# Patient Record
Sex: Male | Born: 1970
Health system: Southern US, Community
[De-identification: ages and names within clinical notes are randomized; demographics above are authoritative.]

## PROBLEM LIST (undated history)

## (undated) DIAGNOSIS — J45909 Unspecified asthma, uncomplicated: Secondary | ICD-10-CM

## (undated) DIAGNOSIS — J302 Other seasonal allergic rhinitis: Secondary | ICD-10-CM

## (undated) HISTORY — PX: BONE CYST EXCISION: SHX376

## (undated) HISTORY — DX: Unspecified asthma, uncomplicated: J45.909

## (undated) HISTORY — DX: Other seasonal allergic rhinitis: J30.2

---

## 2011-12-13 ENCOUNTER — Encounter (HOSPITAL_COMMUNITY): Payer: Self-pay

## 2011-12-13 ENCOUNTER — Emergency Department (HOSPITAL_COMMUNITY): Payer: BC Managed Care – PPO

## 2011-12-13 ENCOUNTER — Emergency Department (HOSPITAL_COMMUNITY)
Admission: EM | Admit: 2011-12-13 | Discharge: 2011-12-13 | Disposition: A | Discharge status: Home / Self Care | Payer: BC Managed Care – PPO | Attending: Emergency Medicine | Admitting: Emergency Medicine

## 2011-12-13 ENCOUNTER — Other Ambulatory Visit: Payer: Self-pay

## 2011-12-13 DIAGNOSIS — R079 Chest pain, unspecified: Secondary | ICD-10-CM | POA: Insufficient documentation

## 2011-12-13 LAB — DIFFERENTIAL
Basophils Absolute: 0 10*3/uL (ref 0.0–0.1)
Eosinophils Relative: 3 % (ref 0–5)
Lymphocytes Relative: 42 % (ref 12–46)
Lymphs Abs: 2.7 10*3/uL (ref 0.7–4.0)
Monocytes Absolute: 0.5 10*3/uL (ref 0.1–1.0)
Monocytes Relative: 8 % (ref 3–12)
Neutro Abs: 2.9 10*3/uL (ref 1.7–7.7)

## 2011-12-13 LAB — CBC
HCT: 42.1 % (ref 39.0–52.0)
Hemoglobin: 14.8 g/dL (ref 13.0–17.0)
MCV: 83 fL (ref 78.0–100.0)
RBC: 5.07 MIL/uL (ref 4.22–5.81)
RDW: 12.3 % (ref 11.5–15.5)
WBC: 6.3 10*3/uL (ref 4.0–10.5)

## 2011-12-13 LAB — BASIC METABOLIC PANEL
CO2: 27 mEq/L (ref 19–32)
Chloride: 104 mEq/L (ref 96–112)
Glucose, Bld: 85 mg/dL (ref 70–99)
Sodium: 140 mEq/L (ref 135–145)

## 2011-12-13 LAB — POCT I-STAT TROPONIN I: Troponin i, poc: 0 ng/mL (ref 0.00–0.08)

## 2011-12-13 MED ORDER — GI COCKTAIL ~~LOC~~
30.0000 mL | Freq: Once | ORAL | Status: AC
  Administered 2011-12-13: 30 mL via ORAL
  Filled 2011-12-13: qty 30

## 2011-12-13 MED ORDER — PANTOPRAZOLE SODIUM 40 MG PO TBEC
40.0000 mg | DELAYED_RELEASE_TABLET | Freq: Once | ORAL | Status: AC
  Administered 2011-12-13: 40 mg via ORAL
  Filled 2011-12-13: qty 1

## 2011-12-13 MED ORDER — HYDROCODONE-ACETAMINOPHEN 5-325 MG PO TABS: 1.0000 | ORAL_TABLET | ORAL | Status: AC | PRN

## 2011-12-13 MED ORDER — OMEPRAZOLE 20 MG PO CPDR: 20.0000 mg | DELAYED_RELEASE_CAPSULE | Freq: Every day | ORAL | Status: DC

## 2011-12-13 MED ORDER — NITROGLYCERIN 0.4 MG SL SUBL
0.4000 mg | SUBLINGUAL_TABLET | SUBLINGUAL | Status: DC | PRN
  Administered 2011-12-13: 0.4 mg via SUBLINGUAL
  Filled 2011-12-13: qty 25

## 2011-12-13 MED ORDER — SUCRALFATE 1 G PO TABS: 1.0000 g | ORAL_TABLET | Freq: Four times a day (QID) | ORAL | Status: DC

## 2011-12-13 NOTE — ED Notes (Signed)
Pt placed on 02 at 2LPM

## 2011-12-13 NOTE — ED Notes (Signed)
Lt. Side chest pain began while running errands. Described as squeezing,  Denies any sob or n/v, does not feel well. Pain radiates into his lt. Arm, nothing makes the pain worse or better.   Pt took 2 aspirin prior to coming Pt is in NAD< skin is pink, warm and dry. Resp. E/u

## 2011-12-13 NOTE — ED Notes (Signed)
Pt st's he was walking in store and developed pain in left chest radiating into left arm.  Pt denied any nausea, vomiting, diaphoresis or shortness of breath.  Pt also st's he had same symptoms approx 2 yrs ago with neg. Stress test.  At this time pt st's chest pain has subsided but continues to have a squeezing feeling in left arm. PT alert and oriented x's 3, skin warm and dry, color appropriate.

## 2011-12-13 NOTE — ED Provider Notes (Signed)
History     CSN: [id number]  Arrival date & time 12/13/11  1704   First MD Initiated Contact with Patient 12/13/11 1848      Chief Complaint  Patient presents with  . Chest Pain    (Consider location/radiation/quality/duration/timing/severity/associated sxs/prior treatment) HPI History provided by pt.   Pt developed pain left anterior chest while making breakfast at 10:30am today.  Describes as intermittent squeezing sensation that radiates down left arm.  Non-exertional, non-positional, non-pleuritic.  No relief w/ aspirin.  No associated fever, cough, SOB, nausea, diaphoresis, LE pain/edema.  Denies recent trauma.  Does not smoke cigarettes and no immediate FH of MI.  Had an exercise stress echo approx 3 years ago that was negative and PCP attributed CP at that time to acid reflux. No RF for PE.    History reviewed. No pertinent past medical history.  History reviewed. No pertinent past surgical history.  No family history on file.  History  Substance Use Topics  . Smoking status: Never Smoker   . Smokeless tobacco: Not on file  . Alcohol Use: No      Review of Systems  All other systems reviewed and are negative.    Allergies  Review of patient's allergies indicates no known allergies.  Home Medications  No current outpatient prescriptions on file.  BP 137/91  Pulse 76  Temp(Src) 97.9 F (36.6 C) (Oral)  Resp 18  SpO2 100%  Physical Exam  Nursing note and vitals reviewed. Constitutional: He is oriented to person, place, and time. He appears well-developed and well-nourished. No distress.  HENT:  Head: Normocephalic and atraumatic.  Eyes:       Normal appearance  Neck: Normal range of motion.  Cardiovascular: Normal rate, regular rhythm and intact distal pulses.   Pulmonary/Chest: Effort normal and breath sounds normal. No respiratory distress.       Tenderness left anterior chest.  No pain w/ movement or ROM LUE.  No pleuritic pain reported  Abdominal:  Soft. Bowel sounds are normal. He exhibits no distension. There is no tenderness. There is no guarding.  Musculoskeletal: Normal range of motion.       No peripheral edema or calf tenderness.  LUE non-tender and full ROM w/out pain.    Neurological: He is alert and oriented to person, place, and time.  Skin: Skin is warm and dry. No rash noted.  Psychiatric: He has a normal mood and affect. His behavior is normal.    ED Course  Procedures (including critical care time)  Labs Reviewed  BASIC METABOLIC PANEL - Abnormal; Notable for the following:    GFR calc non Af Amer 73 (*)    GFR calc Af Amer 84 (*)    All other components within normal limits  CBC  DIFFERENTIAL  POCT I-STAT TROPONIN I  LAB REPORT - SCANNED   Dg Chest 2 View  12/13/2011  *RADIOLOGY REPORT*  Clinical Data: Chest and left arm pain.  CHEST - 2 VIEW  Comparison: None.  Findings: Lungs are clear.  No pneumothorax or pleural fluid. Heart size normal.  IMPRESSION: Negative chest.  Original Report Authenticated By: Bernadene Bell. D'ALESSIO, M.D.     1. Chest pain       MDM  Healthy 41yo M, low risk ACS, presents w/ non-traumatic, intermittent, squeezing pain in left chest, w/ radiation down left arm since 10:30am today.  Non-exertional and no associated sx.  Neg stress echo approx 90yrs ago.  Pain currently minimal and is reproducible w/  palpation on exam.  EKG non-ischemic and CXR/troponin pending.  SL nitro ordered for pain.  Will reassess shortly.  7:11 PM   CXR and troponin neg and labs otherwise unremarkable.  No change in pain w/ SL nitro.  Will try a GI cocktail and po protonix.     Pain has resolved since receiving GI cocktail.  Pt has been diagnosed w/ acid reflux in the past and I suspect that that is the etiology of his symptoms today.  Again, low risk ACS, neg stress 21yrs ago, features of pain atypical w/ exception of radiation, non-ischemic EKG and pain resolved w/ GI cocktail.  D/c'd home w/ prilosec and  carafate and referral to cardiology for outpatient stress test.  Strict return precautions discussed.         Otilio Miu, [place] 12/14/11 1239

## 2011-12-13 NOTE — ED Provider Notes (Signed)
5:40 PM  Date: 12/13/2011  Rate: 76  Rhythm: normal sinus rhythm  QRS Axis: normal  Intervals: normal  ST/T Wave abnormalities: normal  Conduction Disutrbances:none  Narrative Interpretation: Normal EKG  Old EKG Reviewed: none available    Carleene Cooper III, MD 12/13/11 [phone]

## 2011-12-13 NOTE — ED Notes (Signed)
Pt returns from x-ray.  Continues to deny any chest pain st's left arm slightly uncomfortable.  B/P 126/87 P 70  NSR on monitor.  Wife remains at bedside.

## 2011-12-13 NOTE — ED Notes (Signed)
Pt undressed, in gown, on monitor, continuous pulse oximetry and blood pressure cuff; family at bedside 

## 2011-12-13 NOTE — Discharge Instructions (Signed)
Take prilosec once a day for the next two weeks.  Take carafate up to four times a day as needed.  If pain is severe, you can take a vicodin.  Do not drive within four hours of taking this medication (may cause drowsiness or confusion).  Follow up with the cardiologist when you return home for an outpatient stress test.  Please be seen by a physician immediately if you develop worsening pain or shortness of breath.

## 2011-12-14 NOTE — ED Provider Notes (Signed)
Medical screening examination/treatment/procedure(s) were performed by non-physician practitioner and as supervising physician I was immediately available for consultation/collaboration.   Carleene Cooper III, MD 12/14/11 1341

## 2013-12-28 IMAGING — CR DG CHEST 2V
2 series · 2 of 2 positions shown · non-contrast
Comparison: None.

CLINICAL DATA: Chest and left arm pain.

CHEST - 2 VIEW

[w chest pa]
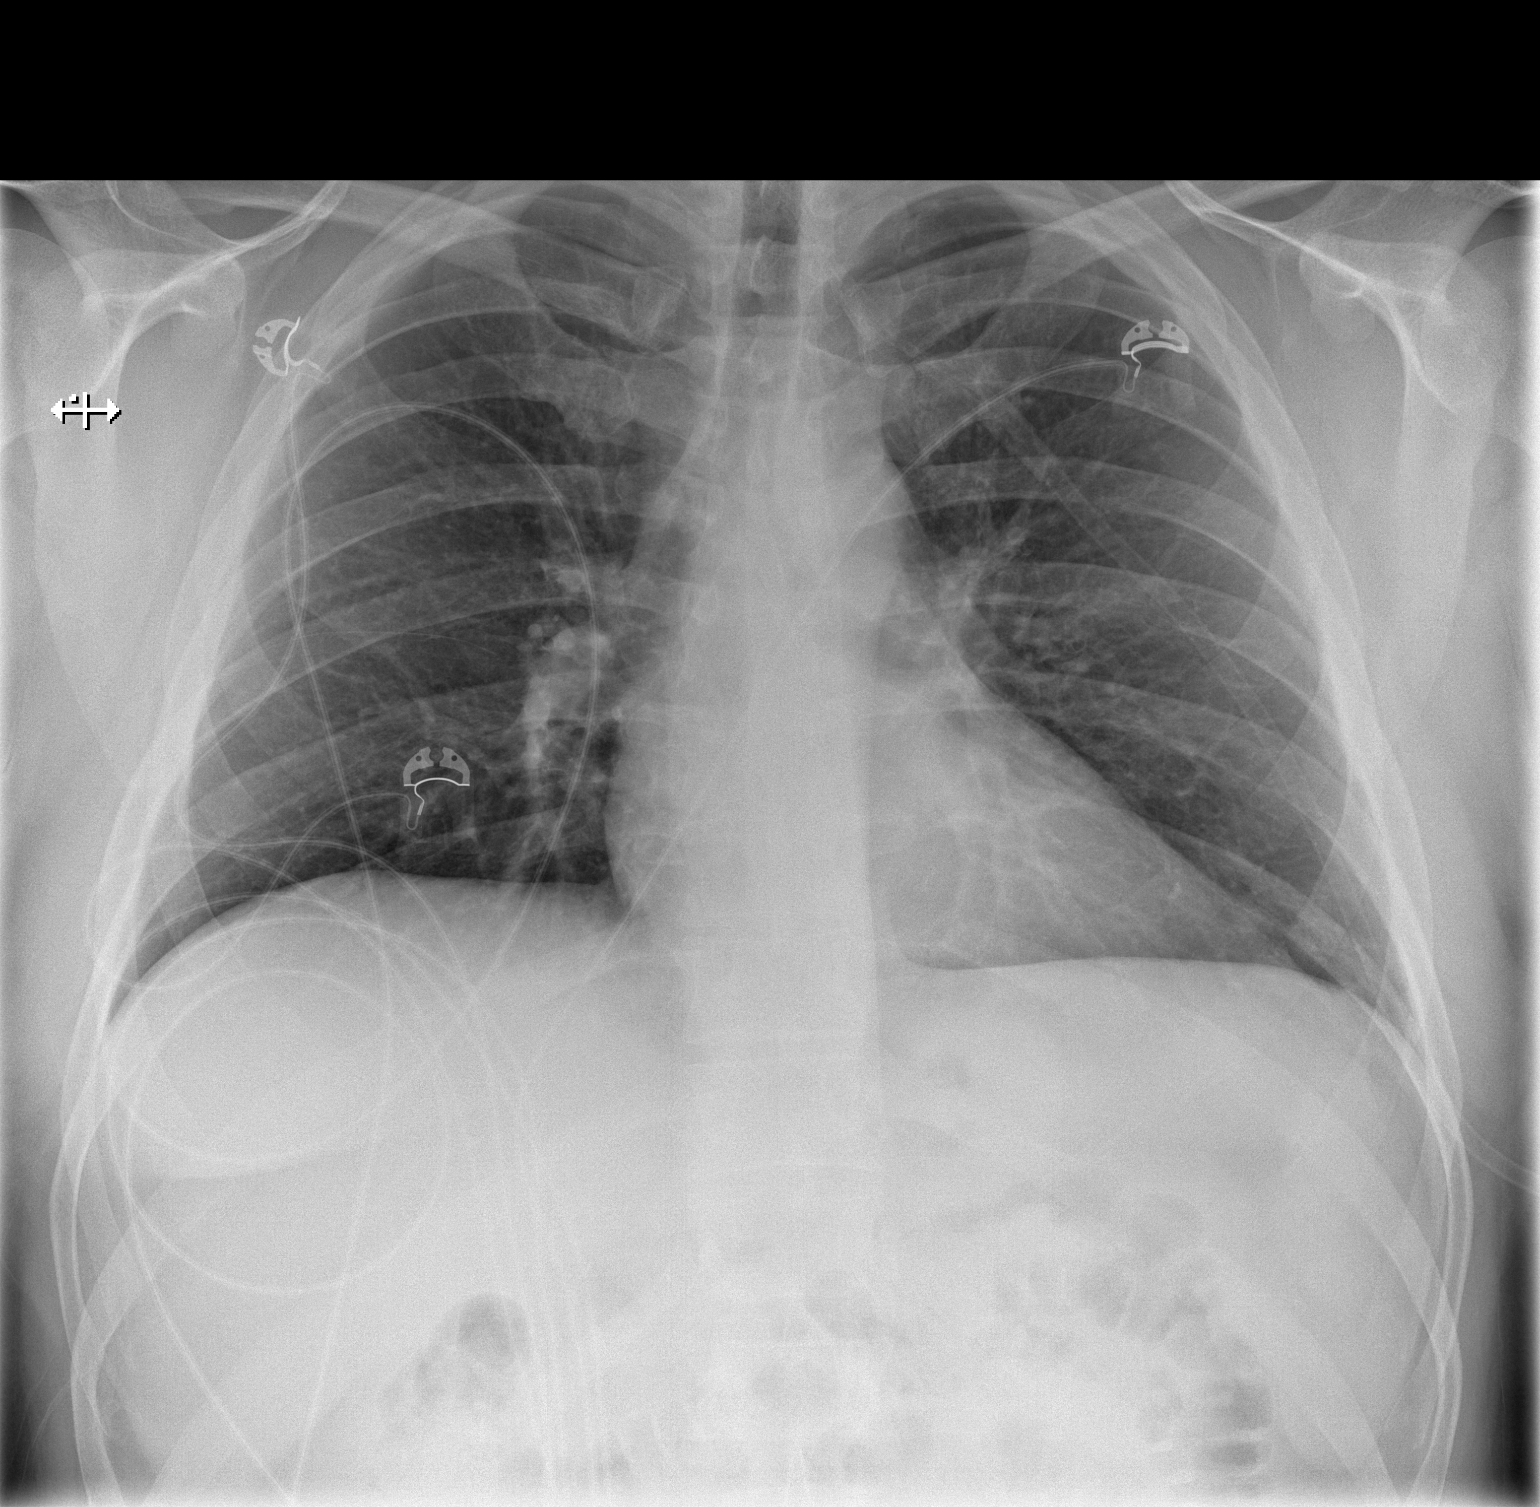

[w chest lat]
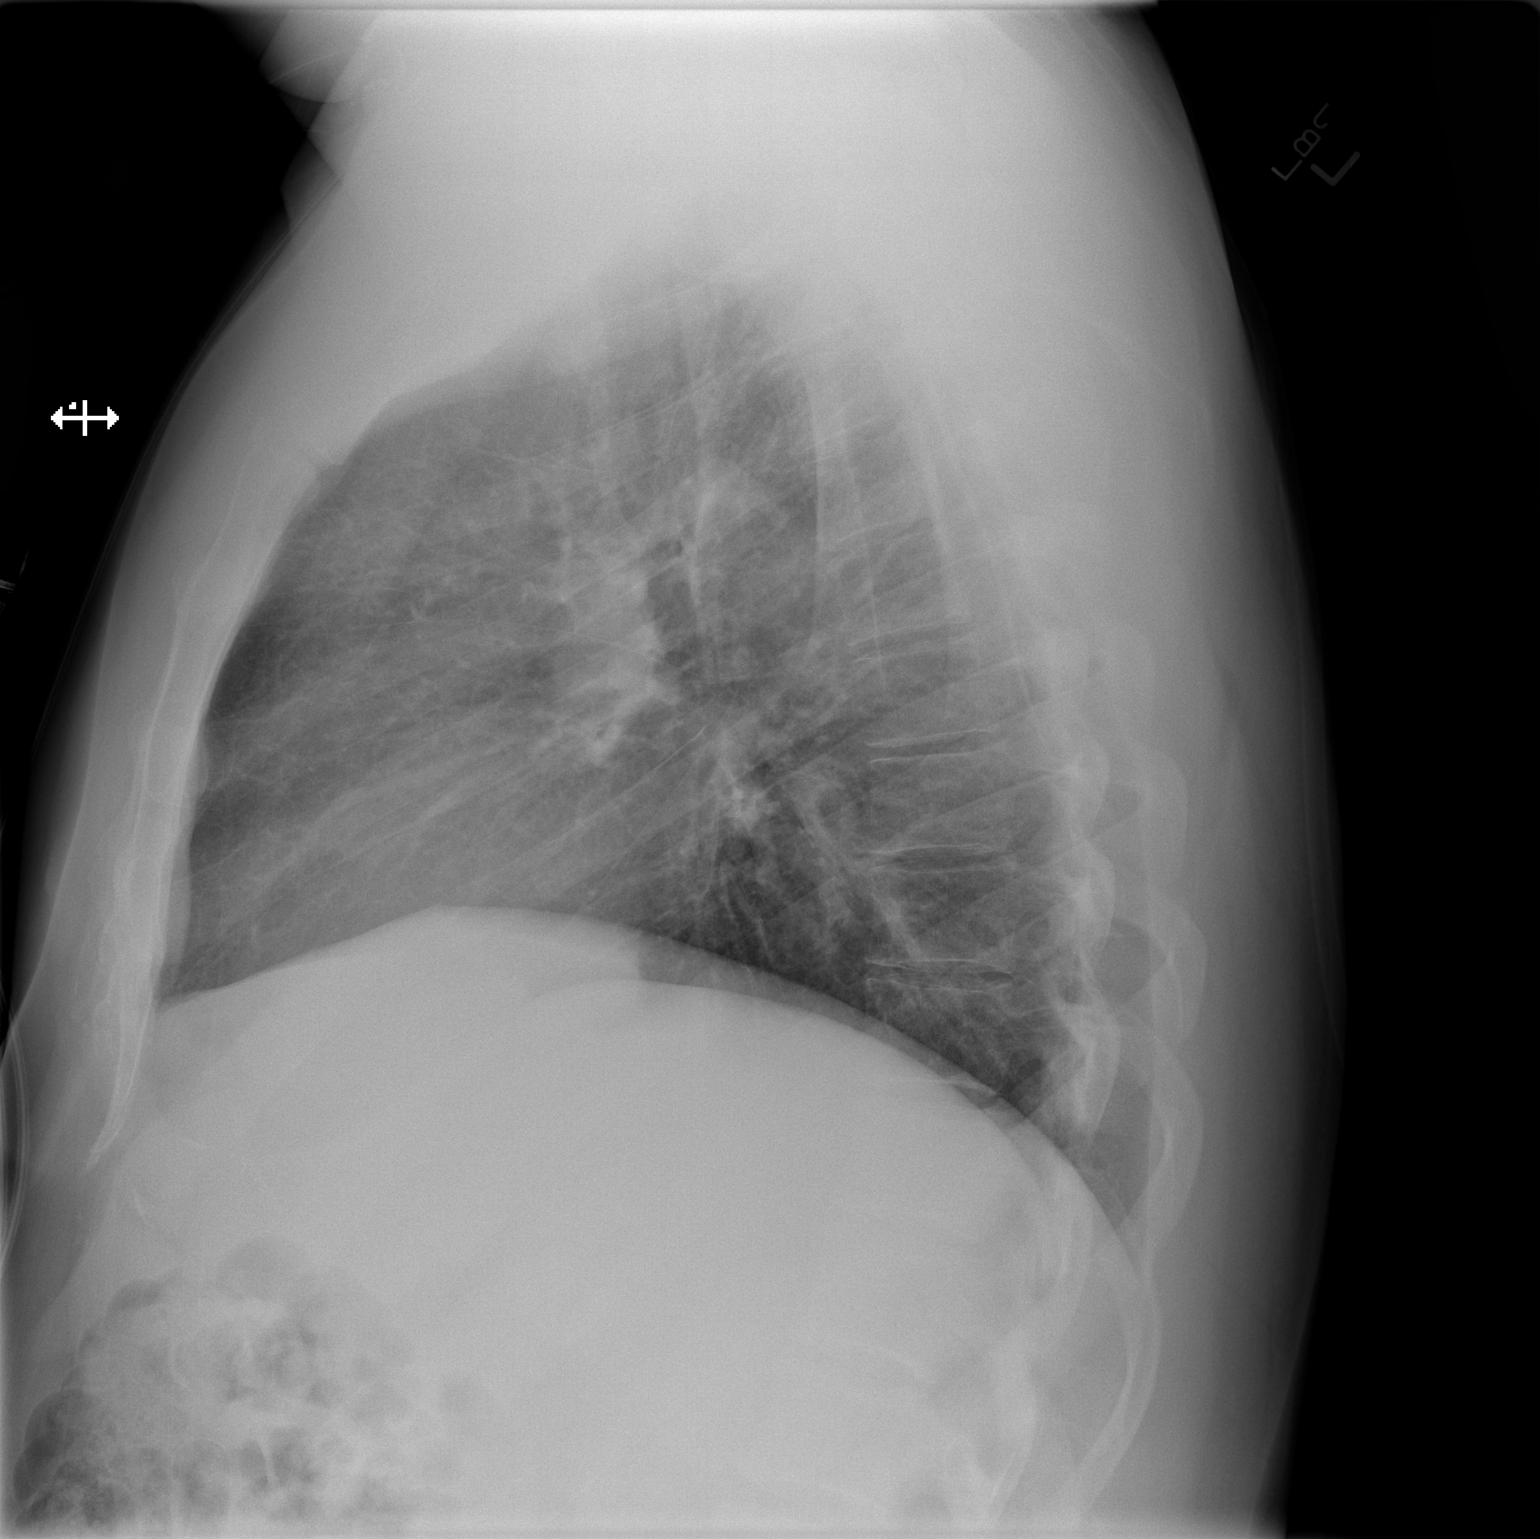

[2 of 2 positions shown; findings below may reference images not displayed]

FINDINGS: Lungs are clear.  No pneumothorax or pleural fluid.
Heart size normal.
IMPRESSION: Negative chest.

## 2019-01-27 ENCOUNTER — Encounter: Payer: Self-pay | Admitting: *Deleted

## 2019-11-01 ENCOUNTER — Emergency Department (HOSPITAL_COMMUNITY)
Admission: EM | Admit: 2019-11-01 | Discharge: 2019-11-01 | Disposition: A | Discharge status: Home / Self Care | Payer: 59 | Attending: Emergency Medicine | Admitting: Emergency Medicine

## 2019-11-01 ENCOUNTER — Emergency Department (HOSPITAL_COMMUNITY): Payer: 59

## 2019-11-01 ENCOUNTER — Encounter (HOSPITAL_COMMUNITY): Payer: Self-pay | Admitting: Emergency Medicine

## 2019-11-01 ENCOUNTER — Other Ambulatory Visit: Payer: Self-pay

## 2019-11-01 DIAGNOSIS — J45909 Unspecified asthma, uncomplicated: Secondary | ICD-10-CM | POA: Insufficient documentation

## 2019-11-01 DIAGNOSIS — R0789 Other chest pain: Secondary | ICD-10-CM

## 2019-11-01 DIAGNOSIS — M79602 Pain in left arm: Secondary | ICD-10-CM | POA: Diagnosis present

## 2019-11-01 LAB — CBC
HCT: 49.5 % (ref 39.0–52.0)
Hemoglobin: 16.5 g/dL (ref 13.0–17.0)
MCH: 28.9 pg (ref 26.0–34.0)
MCHC: 33.3 g/dL (ref 30.0–36.0)
MCV: 86.8 fL (ref 80.0–100.0)
Platelets: 232 10*3/uL (ref 150–400)
RBC: 5.7 MIL/uL (ref 4.22–5.81)
RDW: 11.9 % (ref 11.5–15.5)
WBC: 7.3 10*3/uL (ref 4.0–10.5)
nRBC: 0 % (ref 0.0–0.2)

## 2019-11-01 LAB — BASIC METABOLIC PANEL
Anion gap: 10 (ref 5–15)
BUN: 15 mg/dL (ref 6–20)
CO2: 27 mmol/L (ref 22–32)
Calcium: 9.3 mg/dL (ref 8.9–10.3)
Chloride: 103 mmol/L (ref 98–111)
Creatinine, Ser: 1.21 mg/dL (ref 0.61–1.24)
GFR calc Af Amer: >60 mL/min (ref >60)
GFR calc non Af Amer: >60 mL/min (ref >60)
Glucose, Bld: 107 mg/dL — ABNORMAL HIGH (ref 70–99)
Potassium: 3.8 mmol/L (ref 3.5–5.1)
Sodium: 140 mmol/L (ref 135–145)

## 2019-11-01 LAB — TROPONIN I (HIGH SENSITIVITY): Troponin I (High Sensitivity): 12 ng/L (ref <18)

## 2019-11-01 LAB — PROTIME-INR
INR: 1 (ref 0.8–1.2)
Prothrombin Time: 12.7 seconds (ref 11.4–15.2)

## 2019-11-01 MED ORDER — SODIUM CHLORIDE 0.9% FLUSH: 3.0000 mL | Freq: Once | INTRAVENOUS | Status: DC

## 2019-11-01 NOTE — Discharge Instructions (Addendum)
Your cardiac labs today were reassuring.  Your EKG was normal.  Chest x-ray was clear.  I recommend close follow-up with your primary care physician who may refer you for stress test or echocardiogram as an outpatient.  If you have worsening symptoms, shortness of breath, sudden sweating, feel like you are going to pass out, have chest pressure or tightness, please return to the emergency department.

## 2019-11-01 NOTE — ED Notes (Signed)
Patient verbalizes understanding of discharge instructions. Opportunity for questioning and answers were provided. Armband removed by staff, pt discharged from ED.  

## 2019-11-01 NOTE — ED Triage Notes (Signed)
Patient reports left chest pain and left arm pain with diaphoresis at triage , no SOB , no cough or emesis .

## 2019-11-01 NOTE — ED Provider Notes (Signed)
TIME SEEN: 12:59 AM  CHIEF COMPLAINT: Left arm pain, chest pain  HPI: Patient is a 49 year old male who presents to the emergency department with complaints of left arm discomfort and chest discomfort.  Pain in chest like a "tooth ache" on left side that started Sunday night.  Describes it as a "stinging".  Left arm pressure started Sunday night.  Able to play basketball without symptoms on Saturday.  No SOB, N/V, diaphoresis, dizziness.  Pain waxes and wanes and at times is gone.  Felt worsening arm pain at 11pm.  No A/A factors.  No radiation of pain.  Started in arm then felt a different pain in chest.  No pain with arm movement but is umcomfortable to touch at proximal portion.  When drew blood in triage had a syncopal event which he is had in the past.  No change in chest or arm pain at that time.  No HTN, DM, HLD, tobacco abuse.  Grandfather had MI in 58s.  Flew to [facility] on March 4 and back on the 6th (flight 3 hours).  No history of PE, DVT, exogenous estrogen use, recent fractures, surgery, trauma, hospitalization. No lower extremity swelling or pain. No calf tenderness.  ROS: See HPI Constitutional: no fever  Eyes: no drainage  ENT: no runny nose   Cardiovascular:  no chest pain  Resp: no SOB  GI: no vomiting GU: no dysuria Integumentary: no rash  Allergy: no hives  Musculoskeletal: no leg swelling  Neurological: no slurred speech ROS otherwise negative  PAST MEDICAL HISTORY/PAST SURGICAL HISTORY:  Past Medical History:  Diagnosis Date  . Asthma   . Seasonal allergies     MEDICATIONS:  Prior to Admission medications   Not on File    ALLERGIES:  No Known Allergies  SOCIAL HISTORY:  Social History   Tobacco Use  . Smoking status: Never Smoker  . Smokeless tobacco: Never Used  Substance Use Topics  . Alcohol use: No    FAMILY HISTORY: No family history on file.  EXAM: BP 94/64 (BP Location: Left Arm)   Pulse 66   Temp 97.7 F (36.5 C) (Oral)   Resp  18   Ht 6\' 3"  (1.905 m)   Wt (!) 140 kg   SpO2 93%   BMI 38.58 kg/m  CONSTITUTIONAL: Alert and oriented and responds appropriately to questions. Well-appearing; well-nourished HEAD: Normocephalic EYES: Conjunctivae clear, pupils appear equal, EOM appear intact ENT: normal nose; moist mucous membranes NECK: Supple, normal ROM CARD: RRR; S1 and S2 appreciated; no murmurs, no clicks, no rubs, no gallops RESP: Normal chest excursion without splinting or tachypnea; breath sounds clear and equal bilaterally; no wheezes, no rhonchi, no rales, no hypoxia or respiratory distress, speaking full sentences ABD/GI: Normal bowel sounds; non-distended; soft, non-tender, no rebound, no guarding, no peritoneal signs, no hepatosplenomegaly BACK:  The back appears normal EXT: Normal ROM in all joints; no deformity noted, no edema; no cyanosis, compartments soft, 2+ radial pulses bilaterally, no calf tenderness or calf swelling, mild tenderness in the upper portion of the left upper extremity with palpation but no redness, warmth, swelling, ecchymosis or deformity SKIN: Normal color for age and race; warm; no rash on exposed skin NEURO: Moves all extremities equally PSYCH: The patient's mood and manner are appropriate.   MEDICAL DECISION MAKING: Patient here with atypical chest pain.  Seems mostly to have left arm discomfort.  No sign of septic arthritis, compartment syndrome, DVT, arterial obstruction.  No history of injury.  No  sign of fracture.  No sign of cellulitis, abscess.  He states he was able to exercise on Saturday and did not have any symptoms.  This could be a muscle strain from playing basketball.  I recommended alternating Tylenol and Motrin.  He declines any pain medicine here.  He did have a syncopal event upfront with having his blood drawn but states he has had this before.  Suspect this was vasovagal in nature.  Recommended close follow-up with his primary care physician.  Patient's score through  the heart pathway is 1.  Have offered a second troponin but he declines.  I feel this is reasonable given patient has had ongoing pain for 2 days.  He is concerned that he will have another syncopal event if he has blood drawn.  At this time I do not feel he needs further work-up.  Low suspicion for dissection.  No risk factors for PE other than age.  He is not tachycardic, hypotensive or hypoxic.  I do not think he needs admission at this time.  Patient comfortable with this plan.  Has a PCP for follow-up.  At this time, I do not feel there is any life-threatening condition present. I have reviewed, interpreted and discussed all results (EKG, imaging, lab, urine as appropriate) and exam findings with patient/family. I have reviewed nursing notes and appropriate previous records.  I feel the patient is safe to be discharged home without further emergent workup and can continue workup as an outpatient as needed. Discussed usual and customary return precautions. Patient/family verbalize understanding and are comfortable with this plan.  Outpatient follow-up has been provided as needed. All questions have been answered.    EKG Interpretation  Date/Time:  Tuesday November 01 2019 00:14:41 EDT Ventricular Rate:  55 PR Interval:  178 QRS Duration: 84 QT Interval:  432 QTC Calculation: 413 R Axis:   34 Text Interpretation: Sinus bradycardia Low voltage QRS Borderline ECG Isolated T wave changes in aVL compared to prior in 2013 Confirmed by Rochele Raring [phone]) on 11/01/2019 12:34:25 AM         David Long was evaluated in Emergency Department on 11/01/2019 for the symptoms described in the history of present illness. He was evaluated in the context of the global COVID-19 pandemic, which necessitated consideration that the patient might be at risk for infection with the SARS-CoV-2 virus that causes COVID-19. Institutional protocols and algorithms that pertain to the evaluation of patients at risk for  COVID-19 are in a state of rapid change based on information released by regulatory bodies including the CDC and federal and state organizations. These policies and algorithms were followed during the patient's care in the ED.  Patient was seen wearing N95, face shield, gloves.     David Long, Layla Maw, DO 11/01/19 [phone]

## 2020-01-09 ENCOUNTER — Ambulatory Visit: Payer: 59 | Admitting: Family Medicine

## 2020-03-23 ENCOUNTER — Other Ambulatory Visit: Payer: Self-pay

## 2020-03-23 ENCOUNTER — Ambulatory Visit (INDEPENDENT_AMBULATORY_CARE_PROVIDER_SITE_OTHER): Payer: 59 | Admitting: Family Medicine

## 2020-03-23 ENCOUNTER — Encounter: Payer: Self-pay | Admitting: Family Medicine

## 2020-03-23 VITALS — BP 127/89 | HR 82 | Temp 98.0°F | Ht 75.0 in | Wt 286.8 lb

## 2020-03-23 DIAGNOSIS — R739 Hyperglycemia, unspecified: Secondary | ICD-10-CM

## 2020-03-23 DIAGNOSIS — Z0001 Encounter for general adult medical examination with abnormal findings: Secondary | ICD-10-CM

## 2020-03-23 DIAGNOSIS — Z13220 Encounter for screening for lipoid disorders: Secondary | ICD-10-CM

## 2020-03-23 DIAGNOSIS — M25511 Pain in right shoulder: Secondary | ICD-10-CM | POA: Diagnosis not present

## 2020-03-23 DIAGNOSIS — R0789 Other chest pain: Secondary | ICD-10-CM | POA: Diagnosis not present

## 2020-03-23 DIAGNOSIS — M25569 Pain in unspecified knee: Secondary | ICD-10-CM

## 2020-03-23 DIAGNOSIS — Z1211 Encounter for screening for malignant neoplasm of colon: Secondary | ICD-10-CM

## 2020-03-23 DIAGNOSIS — M25512 Pain in left shoulder: Secondary | ICD-10-CM | POA: Diagnosis not present

## 2020-03-23 MED ORDER — DICLOFENAC SODIUM 75 MG PO TBEC: 75.0000 mg | DELAYED_RELEASE_TABLET | Freq: Two times a day (BID) | ORAL | 0 refills | Status: AC

## 2020-03-23 NOTE — Progress Notes (Signed)
Chief Complaint:  David Long is a 49 y.o. male who presents today for his annual comprehensive physical exam.  He is a new patient.   Assessment/Plan:  New/Acute Problems: Knee Pain Likely has underlying osteoarthritis.  Advised to follow-up with orthopedics  Bilateral shoulder pain Concern for rotator cuff tendinopathy.  Will start diclofenac.  Discussed home exercises and handout was given.  Will consider referral to sports medicine or he can follow-up with orthopedics if not improving with this.  Atypical chest pain No symptoms today and has not had any symptoms for 5 months.  He request stress test for further evaluation.  Place referral today to cardiology.  Also check risk stratification labs with A1c and lipids.  Chronic Problems Addressed Today: Body mass index is 35.85 kg/m. / Obese  BMI Metric Follow Up - 03/23/20 1525      BMI Metric Follow Up-Please document annually   BMI Metric Follow Up Education provided            Preventative Healthcare: Check CBC, CMET, TSH, lipid panel, A1c.  Will place referral for colonoscopy.  Up-to-date on Covid vaccine.  Patient Counseling(The following topics were reviewed and/or handout was given):  -Nutrition: Stressed importance of moderation in sodium/caffeine intake, saturated fat and cholesterol, caloric balance, sufficient intake of fresh fruits, vegetables, and fiber.  -Stressed the importance of regular exercise.   -Substance Abuse: Discussed cessation/primary prevention of tobacco, alcohol, or other drug use; driving or other dangerous activities under the influence; availability of treatment for abuse.   -Injury prevention: Discussed safety belts, safety helmets, smoke detector, smoking near bedding or upholstery.   -Sexuality: Discussed sexually transmitted diseases, partner selection, use of condoms, avoidance of unintended pregnancy and contraceptive alternatives.   -Dental health: Discussed importance of regular tooth  brushing, flossing, and dental visits.  -Health maintenance and immunizations reviewed. Please refer to Health maintenance section.  Return to care in 1 year for next preventative visit.     Subjective:  HPI:  Patient here to establish care.  He would like an annual physical with labs today.  He went to the emergency room about 5 months ago with atypical chest pain.  Had work-up that time which was essentially negative.  He was advised to follow-up with cardiology but this has not yet happened.  Requests referral today.  Has not had any recurrence of chest pain since he was in the ED 4 months ago.  Does have a family history of heart attack in his paternal grandfather.  Patient also has longstanding history of bilateral knee pain.  Saw orthopedics about 5 years ago and was told that he would eventually need replacement.  Symptoms have been overall stable.  He has occasionally taken Advil which helps.  Has also had bilateral shoulder pain for the past 2 to 3 years.  Comes and goes.  Has pain at night when sleeping on his side that subsides after changing sleep position.  Lifestyle Diet: None.  Exercise: None.   No flowsheet data found.  Health Maintenance Due  Topic Date Due  . Hepatitis C Screening  Never done  . HIV Screening  Never done  . TETANUS/TDAP  Never done  . COLONOSCOPY  Never done     ROS: Per HPI, otherwise a complete review of systems was negative.   PMH:  The following were reviewed and entered/updated in epic: Past Medical History:  Diagnosis Date  . Asthma   . Seasonal allergies    There are no  problems to display for this patient.  Past Surgical History:  Procedure Laterality Date  . BONE CYST EXCISION      Family History  Problem Relation Age of Onset  . Diabetes Mother   . Hypertension Father   . Heart attack Paternal Grandfather     Medications- reviewed and updated Current Outpatient Medications  Medication Sig Dispense Refill  .  diclofenac (VOLTAREN) 75 MG EC tablet Take 1 tablet (75 mg total) by mouth 2 (two) times daily. 30 tablet 0   No current facility-administered medications for this visit.    Allergies-reviewed and updated No Known Allergies  Social History   Socioeconomic History  . Marital status: Married    Spouse name: Not on file  . Number of children: Not on file  . Years of education: Not on file  . Highest education level: Not on file  Occupational History  . Not on file  Tobacco Use  . Smoking status: Never Smoker  . Smokeless tobacco: Never Used  Substance and Sexual Activity  . Alcohol use: Yes    Comment: OCC  . Drug use: No  . Sexual activity: Not on file  Other Topics Concern  . Not on file  Social History Narrative  . Not on file   Social Determinants of Health   Financial Resource Strain:   . Difficulty of Paying Living Expenses: Not on file  Food Insecurity:   . Worried About Programme researcher, broadcasting/film/video in the Last Year: Not on file  . Ran Out of Food in the Last Year: Not on file  Transportation Needs:   . Lack of Transportation (Medical): Not on file  . Lack of Transportation (Non-Medical): Not on file  Physical Activity:   . Days of Exercise per Week: Not on file  . Minutes of Exercise per Session: Not on file  Stress:   . Feeling of Stress : Not on file  Social Connections:   . Frequency of Communication with Friends and Family: Not on file  . Frequency of Social Gatherings with Friends and Family: Not on file  . Attends Religious Services: Not on file  . Active Member of Clubs or Organizations: Not on file  . Attends Banker Meetings: Not on file  . Marital Status: Not on file        Objective:  Physical Exam: BP 127/89   Pulse 82   Temp 98 F (36.7 C) (Temporal)   Ht 6\' 3"  (1.905 m)   Wt 286 lb 12.8 oz (130.1 kg)   SpO2 96%   BMI 35.85 kg/m   Body mass index is 35.85 kg/m. Wt Readings from Last 3 Encounters:  03/23/20 286 lb 12.8 oz  (130.1 kg)  11/01/19 (!) 308 lb 10.3 oz (140 kg)   Gen: NAD, resting comfortably HEENT: TMs normal bilaterally. OP clear. No thyromegaly noted.  CV: RRR with no murmurs appreciated Pulm: NWOB, CTAB with no crackles, wheezes, or rhonchi GI: Normal bowel sounds present. Soft, Nontender, Nondistended. MSK: no edema, cyanosis, or clubbing noted - Shoulders: No deformities.  Pain elicited with resisted supraspinatus testing.  Normal internal and external rotation.  Neurovascular intact distally -Knees: No deformities.  Crepitus with active and passive range of motion.  Full range of motion throughout.  Neurovascularly intact distally. Skin: warm, dry Neuro: CN2-12 grossly intact. Strength 5/5 in upper and lower extremities. Reflexes symmetric and intact bilaterally.  Psych: Normal affect and thought content     Fay Bagg M. 11/03/19, MD  03/23/2020 3:26 PM

## 2020-03-23 NOTE — Patient Instructions (Signed)
It was very nice to see you today!  Please start the diclofenac.  Work on the exercises.  You have arthritis in your knee.  Please follow-up with the orthopedist.  I will place a referral for the cardiologist and for your colonoscopy.  We will check blood work today.  I will see you back in year.  Please come back to see me sooner if needed.  Take care, Dr Jerline Pain  Please try these tips to maintain a healthy lifestyle:   Eat at least 3 REAL meals and 1-2 snacks per day.  Aim for no more than 5 hours between eating.  If you eat breakfast, please do so within one hour of getting up.    Each meal should contain half fruits/vegetables, one quarter protein, and one quarter carbs (no bigger than a computer mouse)   Cut down on sweet beverages. This includes juice, soda, and sweet tea.     Drink at least 1 glass of water with each meal and aim for at least 8 glasses per day   Exercise at least 150 minutes every week.    Preventive Care 50-95 Years Old, Male Preventive care refers to lifestyle choices and visits with your health care provider that can promote health and wellness. This includes:  A yearly physical exam. This is also called an annual well check.  Regular dental and eye exams.  Immunizations.  Screening for certain conditions.  Healthy lifestyle choices, such as eating a healthy diet, getting regular exercise, not using drugs or products that contain nicotine and tobacco, and limiting alcohol use. What can I expect for my preventive care visit? Physical exam Your health care provider will check:  Height and weight. These may be used to calculate body mass index (BMI), which is a measurement that tells if you are at a healthy weight.  Heart rate and blood pressure.  Your skin for abnormal spots. Counseling Your health care provider may ask you questions about:  Alcohol, tobacco, and drug use.  Emotional well-being.  Home and relationship  well-being.  Sexual activity.  Eating habits.  Work and work Statistician. What immunizations do I need?  Influenza (flu) vaccine  This is recommended every year. Tetanus, diphtheria, and pertussis (Tdap) vaccine  You may need a Td booster every 10 years. Varicella (chickenpox) vaccine  You may need this vaccine if you have not already been vaccinated. Zoster (shingles) vaccine  You may need this after age 76. Measles, mumps, and rubella (MMR) vaccine  You may need at least one dose of MMR if you were born in 1957 or later. You may also need a second dose. Pneumococcal conjugate (PCV13) vaccine  You may need this if you have certain conditions and were not previously vaccinated. Pneumococcal polysaccharide (PPSV23) vaccine  You may need one or two doses if you smoke cigarettes or if you have certain conditions. Meningococcal conjugate (MenACWY) vaccine  You may need this if you have certain conditions. Hepatitis A vaccine  You may need this if you have certain conditions or if you travel or work in places where you may be exposed to hepatitis A. Hepatitis B vaccine  You may need this if you have certain conditions or if you travel or work in places where you may be exposed to hepatitis B. Haemophilus influenzae type b (Hib) vaccine  You may need this if you have certain risk factors. Human papillomavirus (HPV) vaccine  If recommended by your health care provider, you may need three doses  over 6 months. You may receive vaccines as individual doses or as more than one vaccine together in one shot (combination vaccines). Talk with your health care provider about the risks and benefits of combination vaccines. What tests do I need? Blood tests  Lipid and cholesterol levels. These may be checked every 5 years, or more frequently if you are over 51 years old.  Hepatitis C test.  Hepatitis B test. Screening  Lung cancer screening. You may have this screening every year  starting at age 59 if you have a 30-pack-year history of smoking and currently smoke or have quit within the past 15 years.  Prostate cancer screening. Recommendations will vary depending on your family history and other risks.  Colorectal cancer screening. All adults should have this screening starting at age 10 and continuing until age 30. Your health care provider may recommend screening at age 91 if you are at increased risk. You will have tests every 1-10 years, depending on your results and the type of screening test.  Diabetes screening. This is done by checking your blood sugar (glucose) after you have not eaten for a while (fasting). You may have this done every 1-3 years.  Sexually transmitted disease (STD) testing. Follow these instructions at home: Eating and drinking  Eat a diet that includes fresh fruits and vegetables, whole grains, lean protein, and low-fat dairy products.  Take vitamin and mineral supplements as recommended by your health care provider.  Do not drink alcohol if your health care provider tells you not to drink.  If you drink alcohol: ? Limit how much you have to 0-2 drinks a day. ? Be aware of how much alcohol is in your drink. In the [place] one drink equals one 12 oz bottle of beer (355 mL), one 5 oz glass of wine (148 mL), or one 1 oz glass of hard liquor (44 mL). Lifestyle  Take daily care of your teeth and gums.  Stay active. Exercise for at least 30 minutes on 5 or more days each week.  Do not use any products that contain nicotine or tobacco, such as cigarettes, e-cigarettes, and chewing tobacco. If you need help quitting, ask your health care provider.  If you are sexually active, practice safe sex. Use a condom or other form of protection to prevent STIs (sexually transmitted infections).  Talk with your health care provider about taking a low-dose aspirin every day starting at age 54. What's next?  Go to your health care provider once a year  for a well check visit.  Ask your health care provider how often you should have your eyes and teeth checked.  Stay up to date on all vaccines. This information is not intended to replace advice given to you by your health care provider. Make sure you discuss any questions you have with your health care provider. Document Revised: 07/15/2018 Document Reviewed: 07/15/2018 Elsevier Patient Education  2020 [employer].

## 2020-03-24 LAB — HEMOGLOBIN A1C
Hgb A1c MFr Bld: 5.9 % of total Hgb — ABNORMAL HIGH (ref <5.7)
Mean Plasma Glucose: 123 (calc)
eAG (mmol/L): 6.8 (calc)

## 2020-03-24 LAB — COMPREHENSIVE METABOLIC PANEL
AG Ratio: 2.1 (calc) (ref 1.0–2.5)
ALT: 30 U/L (ref 9–46)
AST: 18 U/L (ref 10–35)
Albumin: 4.5 g/dL (ref 3.6–5.1)
Alkaline phosphatase (APISO): 65 U/L (ref 35–144)
BUN: 15 mg/dL (ref 7–25)
CO2: 25 mmol/L (ref 20–32)
Calcium: 9.4 mg/dL (ref 8.6–10.3)
Chloride: 103 mmol/L (ref 98–110)
Creat: 1.19 mg/dL (ref 0.70–1.33)
Globulin: 2.1 g/dL (calc) (ref 1.9–3.7)
Glucose, Bld: 115 mg/dL — ABNORMAL HIGH (ref 65–99)
Potassium: 4.2 mmol/L (ref 3.5–5.3)
Sodium: 138 mmol/L (ref 135–146)
Total Bilirubin: 0.5 mg/dL (ref 0.2–1.2)
Total Protein: 6.6 g/dL (ref 6.1–8.1)

## 2020-03-24 LAB — CBC
HCT: 50.5 % — ABNORMAL HIGH (ref 38.5–50.0)
Hemoglobin: 16.7 g/dL (ref 13.2–17.1)
MCH: 28.9 pg (ref 27.0–33.0)
MCHC: 33.1 g/dL (ref 32.0–36.0)
MCV: 87.4 fL (ref 80.0–100.0)
MPV: 10.9 fL (ref 7.5–12.5)
Platelets: 239 10*3/uL (ref 140–400)
RBC: 5.78 10*6/uL (ref 4.20–5.80)
RDW: 12.6 % (ref 11.0–15.0)
WBC: 7.1 10*3/uL (ref 3.8–10.8)

## 2020-03-24 LAB — LIPID PANEL
Cholesterol: 187 mg/dL (ref <200)
HDL: 30 mg/dL — ABNORMAL LOW (ref >=40)
Non-HDL Cholesterol (Calc): 157 mg/dL (calc) — ABNORMAL HIGH (ref <130)
Total CHOL/HDL Ratio: 6.2 (calc) — ABNORMAL HIGH (ref <5.0)
Triglycerides: 517 mg/dL — ABNORMAL HIGH (ref <150)

## 2020-03-24 LAB — TSH: TSH: 2.33 mIU/L (ref 0.40–4.50)

## 2020-03-26 ENCOUNTER — Encounter: Payer: Self-pay | Admitting: Family Medicine

## 2020-03-26 DIAGNOSIS — E785 Hyperlipidemia, unspecified: Secondary | ICD-10-CM | POA: Insufficient documentation

## 2020-03-26 DIAGNOSIS — R739 Hyperglycemia, unspecified: Secondary | ICD-10-CM | POA: Insufficient documentation

## 2020-03-26 NOTE — Progress Notes (Signed)
Please inform patient of the following:  Blood sugar and cholesterol both borderline. Do not need to make any changes to his treatment plan at this time. Would like for him to keep working on diet and exercise and we can recheck in a year.  David Long. [name], MD 03/26/2020 10:07 AM

## 2020-05-15 NOTE — Progress Notes (Deleted)
Referring-David [name], MD Reason for referral-chest pain  HPI: 49 year old male for evaluation of chest pain at request of Jacquiline Doe, MD.  Laboratories August 2021 showed normal hemoglobin, creatinine 1.19, potassium 4.2, total cholesterol 187, triglycerides 517 and normal TSH.  Current Outpatient Medications  Medication Sig Dispense Refill   diclofenac (VOLTAREN) 75 MG EC tablet Take 1 tablet (75 mg total) by mouth 2 (two) times daily. 30 tablet 0   No current facility-administered medications for this visit.    No Known Allergies  Past Medical History:  Diagnosis Date   Asthma    Seasonal allergies     Past Surgical History:  Procedure Laterality Date   BONE CYST EXCISION      Social History   Socioeconomic History   Marital status: Married    Spouse name: Not on file   Number of children: Not on file   Years of education: Not on file   Highest education level: Not on file  Occupational History   Not on file  Tobacco Use   Smoking status: Never Smoker   Smokeless tobacco: Never Used  Substance and Sexual Activity   Alcohol use: Yes    Comment: OCC   Drug use: No   Sexual activity: Not on file  Other Topics Concern   Not on file  Social History Narrative   Not on file   Social Determinants of Health   Financial Resource Strain:    Difficulty of Paying Living Expenses: Not on file  Food Insecurity:    Worried About Running Out of Food in the Last Year: Not on file   Ran Out of Food in the Last Year: Not on file  Transportation Needs:    Lack of Transportation (Medical): Not on file   Lack of Transportation (Non-Medical): Not on file  Physical Activity:    Days of Exercise per Week: Not on file   Minutes of Exercise per Session: Not on file  Stress:    Feeling of Stress : Not on file  Social Connections:    Frequency of Communication with Friends and Family: Not on file   Frequency of Social Gatherings with Friends  and Family: Not on file   Attends Religious Services: Not on file   Active Member of Clubs or Organizations: Not on file   Attends Banker Meetings: Not on file   Marital Status: Not on file  Intimate Partner Violence:    Fear of Current or Ex-Partner: Not on file   Emotionally Abused: Not on file   Physically Abused: Not on file   Sexually Abused: Not on file    Family History  Problem Relation Age of Onset   Diabetes Mother    Hypertension Father    Heart attack Paternal Grandfather     ROS: no fevers or chills, productive cough, hemoptysis, dysphasia, odynophagia, melena, hematochezia, dysuria, hematuria, rash, seizure activity, orthopnea, PND, pedal edema, claudication. Remaining systems are negative.  Physical Exam:   There were no vitals taken for this visit.  General:  Well developed/well nourished in NAD Skin warm/dry Patient not depressed No peripheral clubbing Back-normal HEENT-normal/normal eyelids Neck supple/normal carotid upstroke bilaterally; no bruits; no JVD; no thyromegaly chest - CTA/ normal expansion CV - RRR/normal S1 and S2; no murmurs, rubs or gallops;  PMI nondisplaced Abdomen -NT/ND, no HSM, no mass, + bowel sounds, no bruit 2+ femoral pulses, no bruits Ext-no edema, chords, 2+ DP Neuro-grossly nonfocal  ECG - personally reviewed  A/P  1 chest pain-  2 hypertriglyceridemia-  Olga Millers, MD

## 2020-05-22 ENCOUNTER — Ambulatory Visit: Payer: 59 | Admitting: Cardiology

## 2020-05-30 NOTE — Progress Notes (Deleted)
Referring-David [name], MD Reason for referral-chest pain  HPI: 49 year old male for evaluation of chest pain at request of Jacquiline Doe, MD.  Patient seen in the emergency room March 2021 with chest pain.  Troponin normal.  Electrocardiogram showed sinus bradycardia with no ST changes.  Current Outpatient Medications  Medication Sig Dispense Refill  . diclofenac (VOLTAREN) 75 MG EC tablet Take 1 tablet (75 mg total) by mouth 2 (two) times daily. 30 tablet 0   No current facility-administered medications for this visit.    No Known Allergies  Past Medical History:  Diagnosis Date  . Asthma   . Seasonal allergies     Past Surgical History:  Procedure Laterality Date  . BONE CYST EXCISION      Social History   Socioeconomic History  . Marital status: Married    Spouse name: Not on file  . Number of children: Not on file  . Years of education: Not on file  . Highest education level: Not on file  Occupational History  . Not on file  Tobacco Use  . Smoking status: Never Smoker  . Smokeless tobacco: Never Used  Substance and Sexual Activity  . Alcohol use: Yes    Comment: OCC  . Drug use: No  . Sexual activity: Not on file  Other Topics Concern  . Not on file  Social History Narrative  . Not on file   Social Determinants of Health   Financial Resource Strain:   . Difficulty of Paying Living Expenses: Not on file  Food Insecurity:   . Worried About Programme researcher, broadcasting/film/video in the Last Year: Not on file  . Ran Out of Food in the Last Year: Not on file  Transportation Needs:   . Lack of Transportation (Medical): Not on file  . Lack of Transportation (Non-Medical): Not on file  Physical Activity:   . Days of Exercise per Week: Not on file  . Minutes of Exercise per Session: Not on file  Stress:   . Feeling of Stress : Not on file  Social Connections:   . Frequency of Communication with Friends and Family: Not on file  . Frequency of Social Gatherings with  Friends and Family: Not on file  . Attends Religious Services: Not on file  . Active Member of Clubs or Organizations: Not on file  . Attends Banker Meetings: Not on file  . Marital Status: Not on file  Intimate Partner Violence:   . Fear of Current or Ex-Partner: Not on file  . Emotionally Abused: Not on file  . Physically Abused: Not on file  . Sexually Abused: Not on file    Family History  Problem Relation Age of Onset  . Diabetes Mother   . Hypertension Father   . Heart attack Paternal Grandfather     ROS: no fevers or chills, productive cough, hemoptysis, dysphasia, odynophagia, melena, hematochezia, dysuria, hematuria, rash, seizure activity, orthopnea, PND, pedal edema, claudication. Remaining systems are negative.  Physical Exam:   There were no vitals taken for this visit.  General:  Well developed/well nourished in NAD Skin warm/dry Patient not depressed No peripheral clubbing Back-normal HEENT-normal/normal eyelids Neck supple/normal carotid upstroke bilaterally; no bruits; no JVD; no thyromegaly chest - CTA/ normal expansion CV - RRR/normal S1 and S2; no murmurs, rubs or gallops;  PMI nondisplaced Abdomen -NT/ND, no HSM, no mass, + bowel sounds, no bruit 2+ femoral pulses, no bruits Ext-no edema, chords, 2+ DP Neuro-grossly nonfocal  ECG -  personally reviewed  A/P  1 chest pain-symptoms are atypical.  Olga Millers, MD

## 2020-06-05 ENCOUNTER — Ambulatory Visit: Payer: 59 | Admitting: Cardiology

## 2020-12-18 ENCOUNTER — Telehealth: Payer: 59 | Admitting: Family Medicine

## 2021-03-29 ENCOUNTER — Encounter: Payer: 59 | Admitting: Family Medicine

## 2021-11-16 IMAGING — CR DG CHEST 2V
2 series · 2 of 2 positions shown · non-contrast
Comparison: 12/13/2011

CLINICAL DATA: Chest pain

EXAM:
CHEST - 2 VIEW

[chest pa]
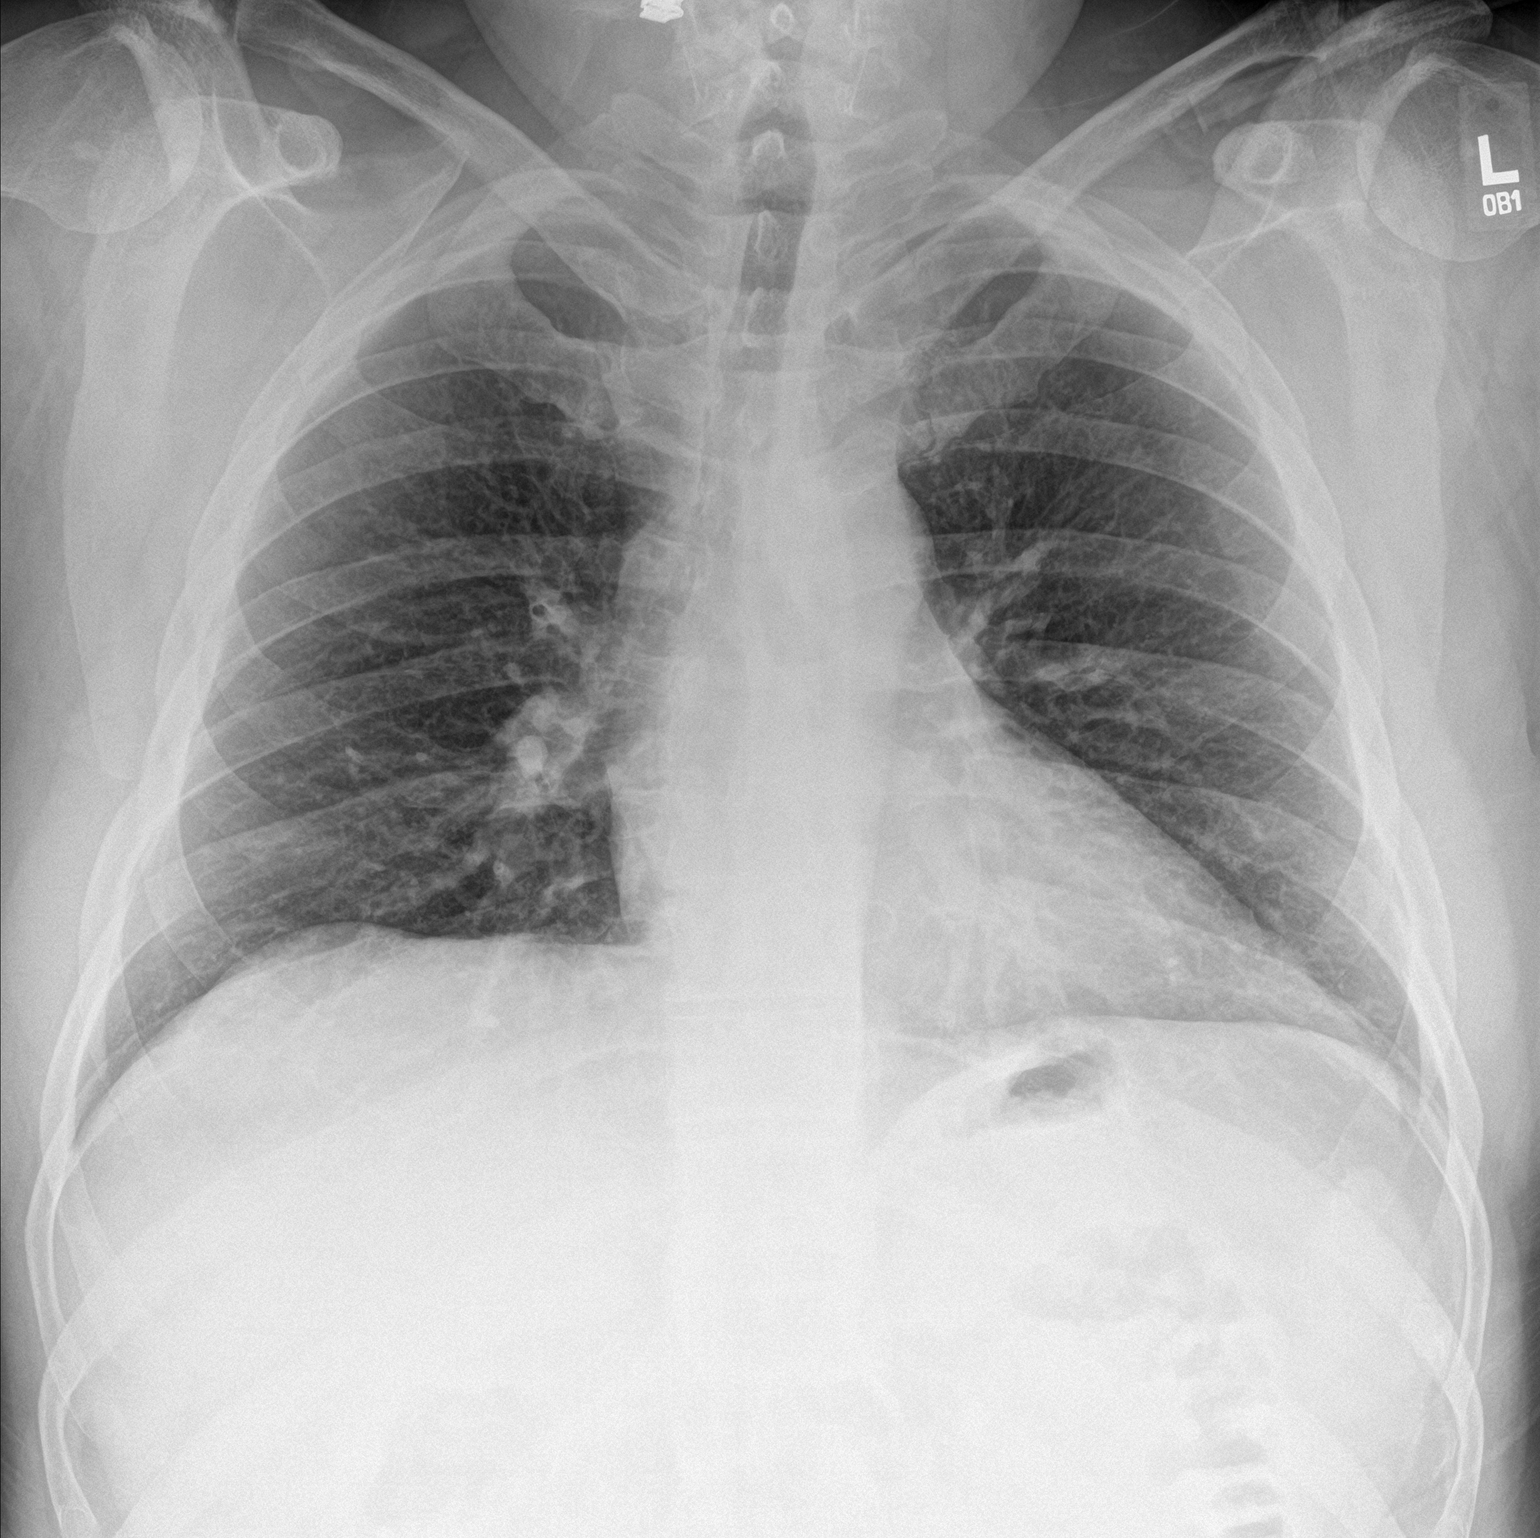

[chest lat]
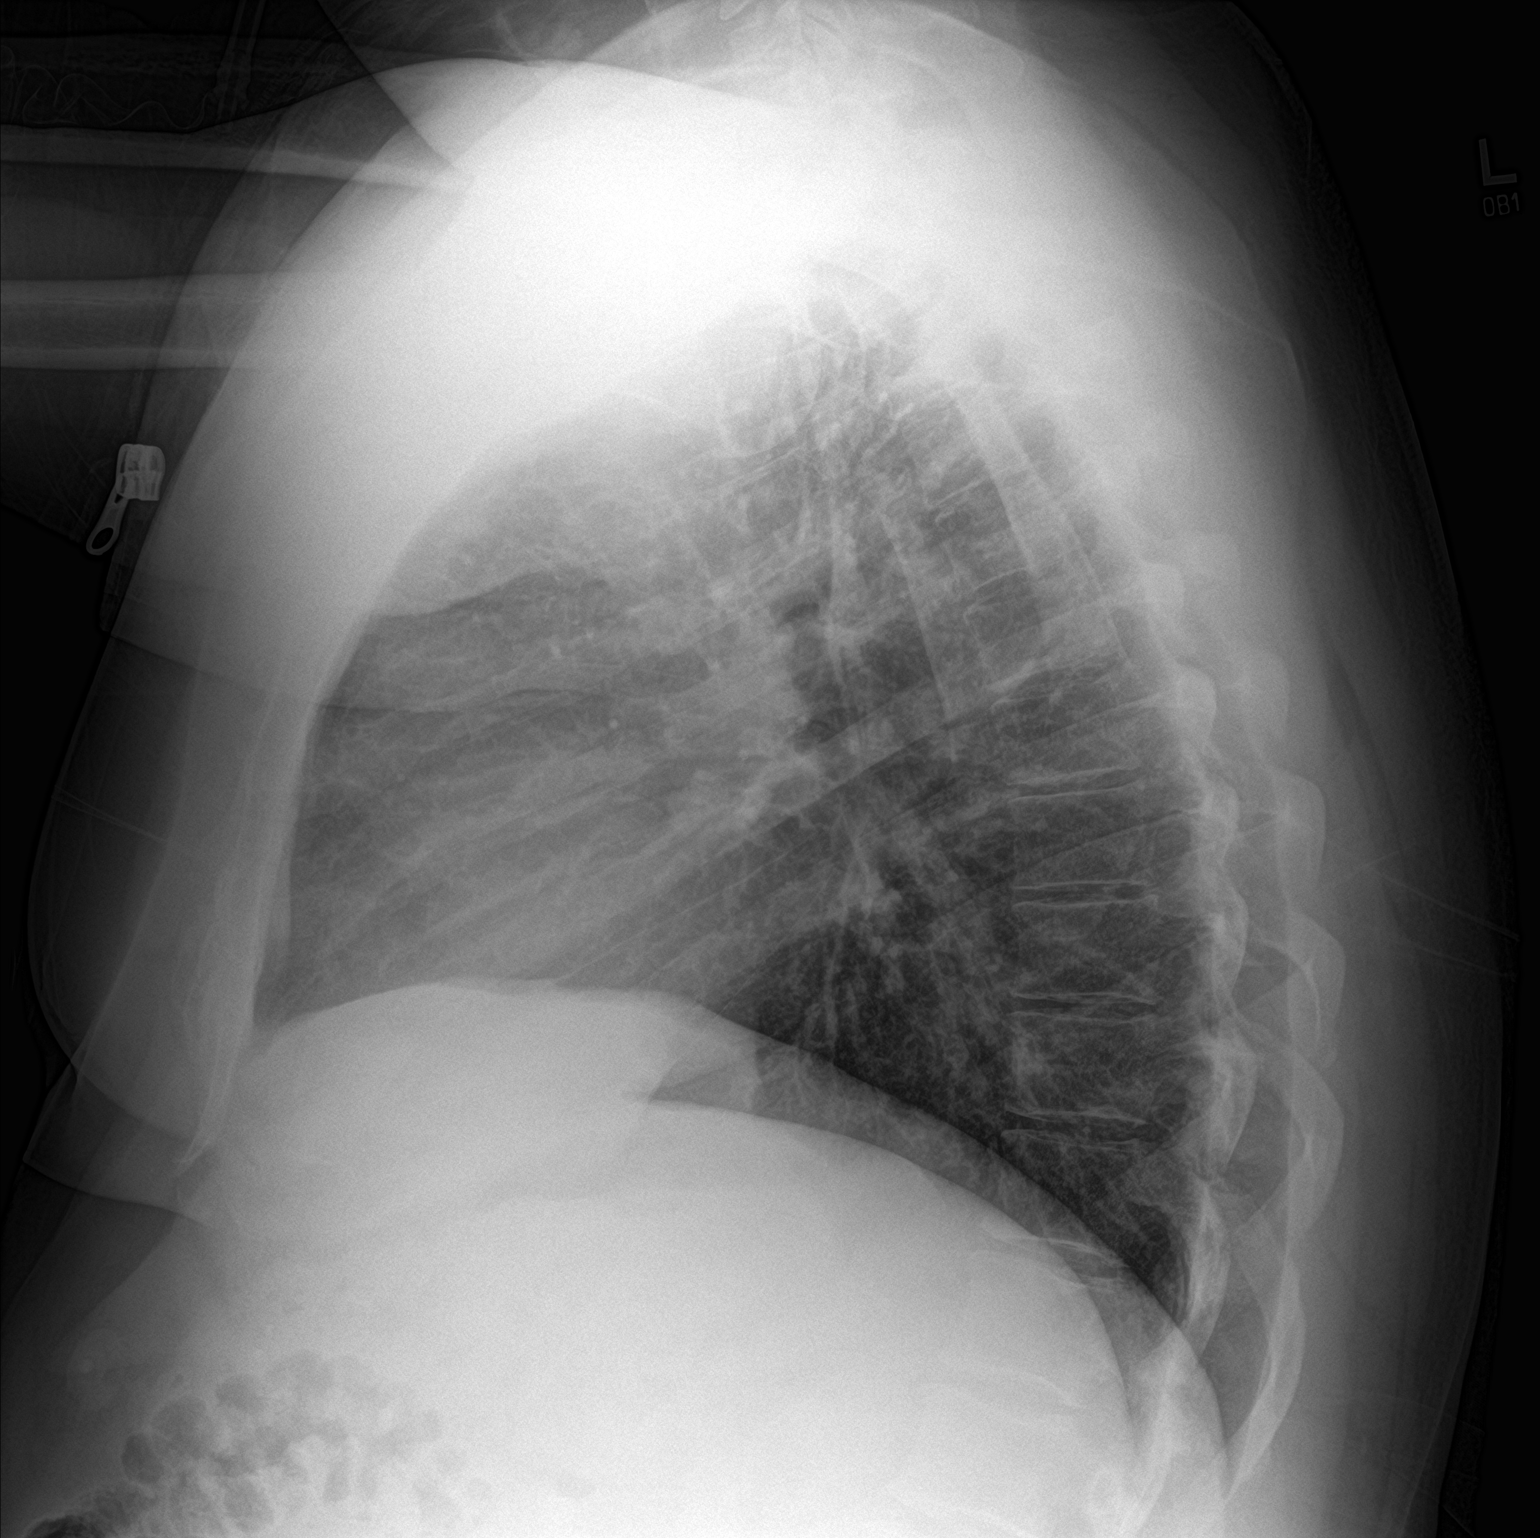

[2 of 2 positions shown; findings below may reference images not displayed]

FINDINGS: The heart size and mediastinal contours are within normal limits.
Both lungs are clear. The visualized skeletal structures are
unremarkable.
IMPRESSION: No active cardiopulmonary disease.

## 2022-03-27 ENCOUNTER — Other Ambulatory Visit: Payer: Self-pay | Admitting: Family Medicine

## 2022-03-27 DIAGNOSIS — E785 Hyperlipidemia, unspecified: Secondary | ICD-10-CM

## 2022-06-12 ENCOUNTER — Ambulatory Visit
Admission: RE | Admit: 2022-06-12 | Discharge: 2022-06-12 | Disposition: A | Discharge status: Home / Self Care | Payer: No Typology Code available for payment source | Source: Ambulatory Visit | Attending: Family Medicine | Admitting: Family Medicine

## 2022-06-12 DIAGNOSIS — E785 Hyperlipidemia, unspecified: Secondary | ICD-10-CM

## 2023-03-20 ENCOUNTER — Telehealth: Payer: Self-pay | Admitting: Family Medicine

## 2023-03-20 NOTE — Telephone Encounter (Signed)
 Called patient to schedule next available cpe. No answer left VM
